# Patient Record
Sex: Male | Born: 1975 | Race: White | Hispanic: No | Marital: Married | Smoking: Never smoker
Health system: Southern US, Community
[De-identification: ages and names within clinical notes are randomized; demographics above are authoritative.]

## PROBLEM LIST (undated history)

## (undated) DIAGNOSIS — K219 Gastro-esophageal reflux disease without esophagitis: Secondary | ICD-10-CM

## (undated) DIAGNOSIS — I10 Essential (primary) hypertension: Secondary | ICD-10-CM

## (undated) HISTORY — PX: WISDOM TOOTH EXTRACTION: SHX21

## (undated) HISTORY — PX: CHOLECYSTECTOMY: SHX55

## (undated) HISTORY — PX: ESOPHAGOGASTRODUODENOSCOPY: SHX1529

---

## 2008-06-10 ENCOUNTER — Inpatient Hospital Stay: Payer: Self-pay | Admitting: Surgery

## 2009-04-20 IMAGING — CT CT STONE STUDY
1 of 2 series · 15 of 32 positions shown, 19 images · non-contrast
Comparison: none

REASON FOR EXAM: epigastric pain radiatign to left flank
COMMENTS:

[Series 2: stone · axial · 0.75mm/px · z∈[-472,-46]mm · 15 of 160 slices shown, 19 images]
[im 12/160  soft-tissue]
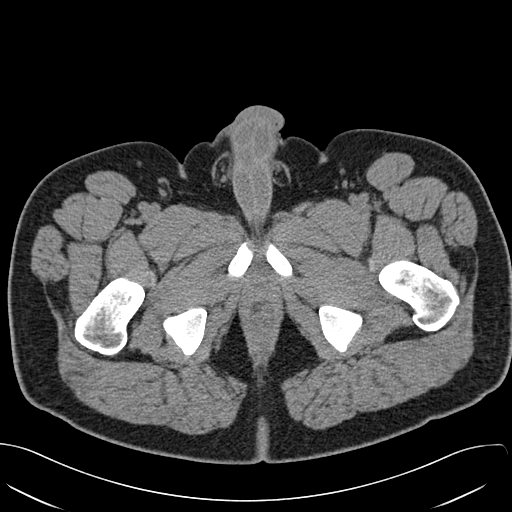
[im 12/160  bone]
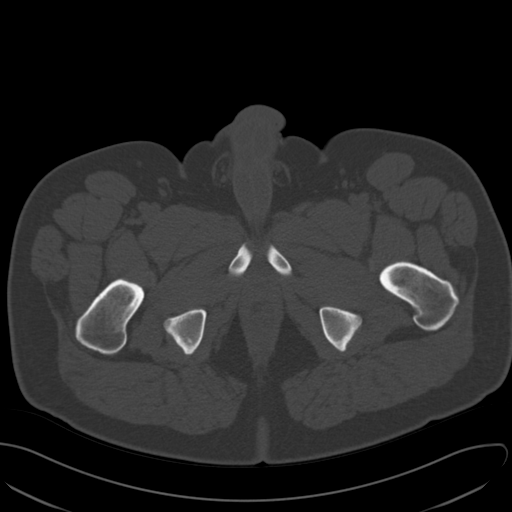
[im 24/160  soft-tissue]
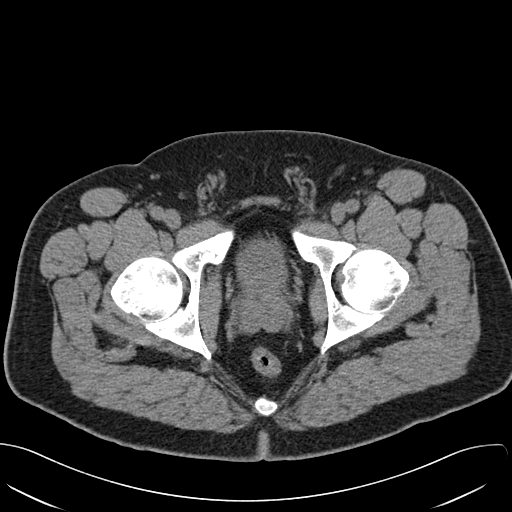
[im 36/160  soft-tissue]
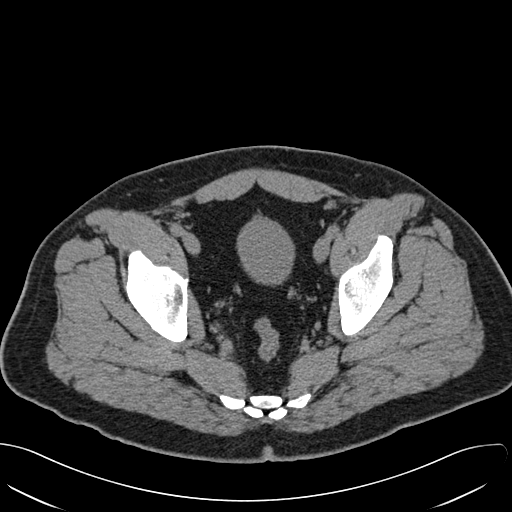
[im 48/160  soft-tissue]
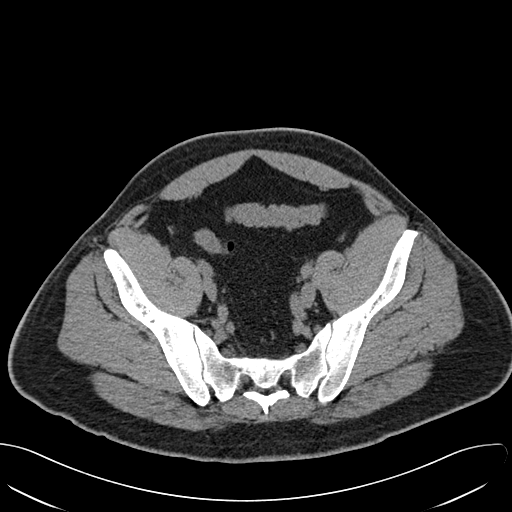
[im 59/160  soft-tissue]
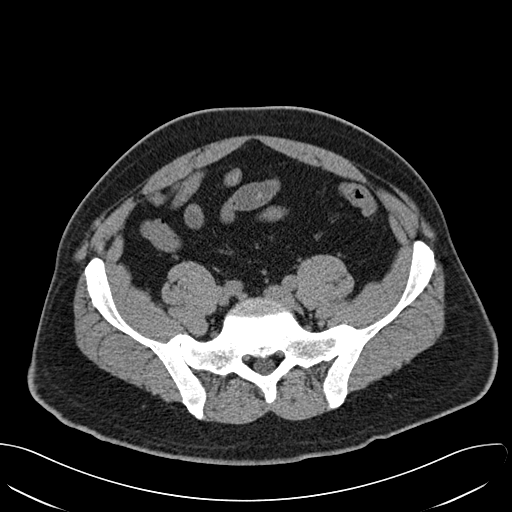
[im 71/160  soft-tissue]
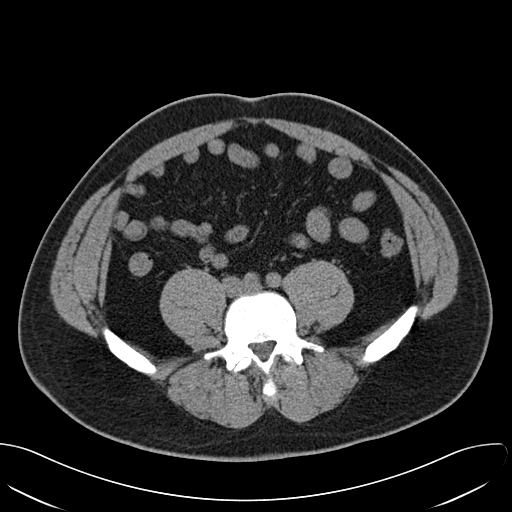
[im 83/160  soft-tissue]
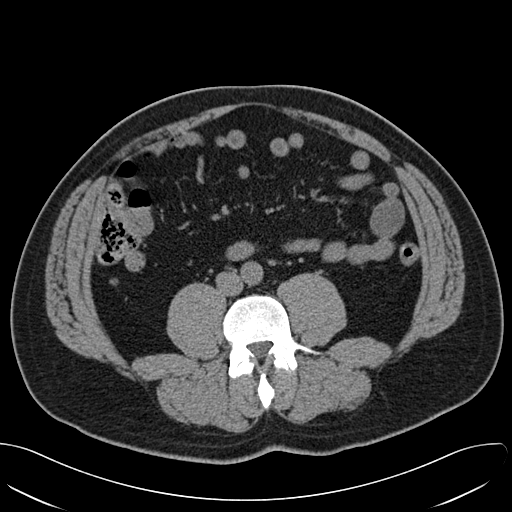
[im 95/160  soft-tissue]
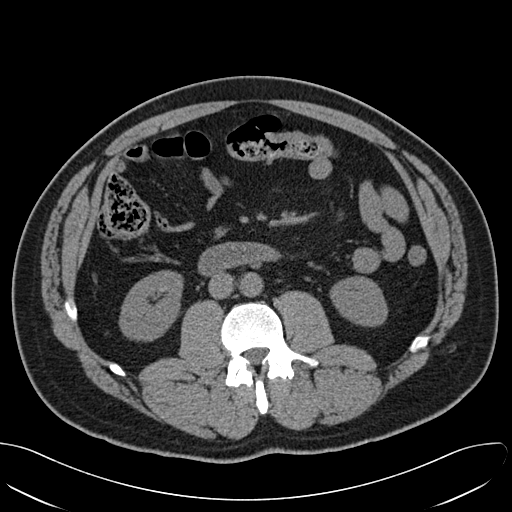
[im 107/160  soft-tissue]
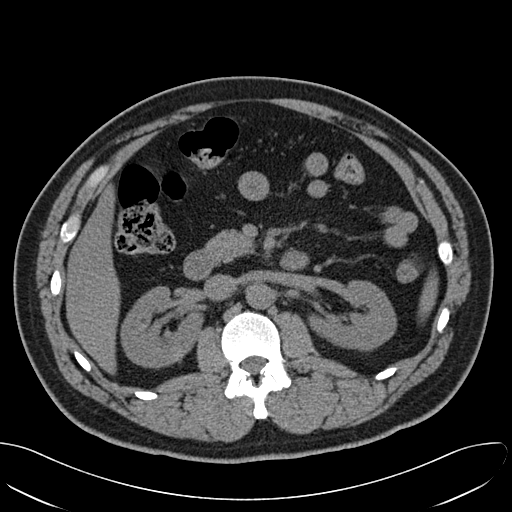
[im 107/160  bone]
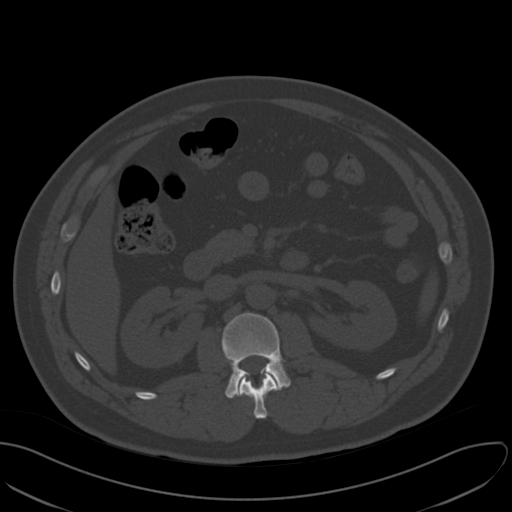
[im 118/160  soft-tissue]
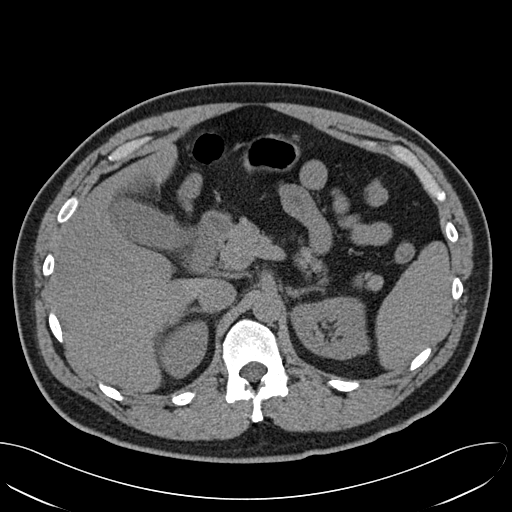
[im 130/160  soft-tissue]
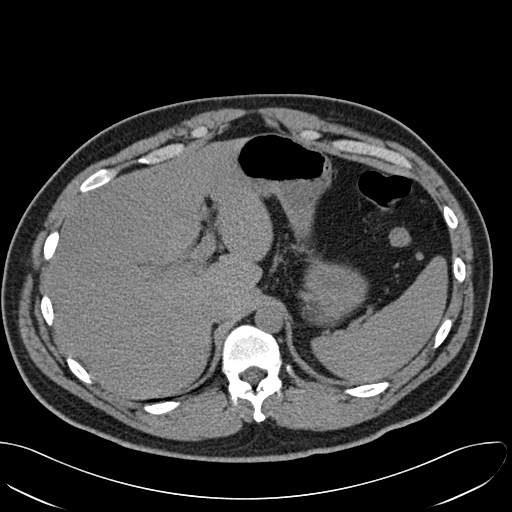
[im 136/160  lung]
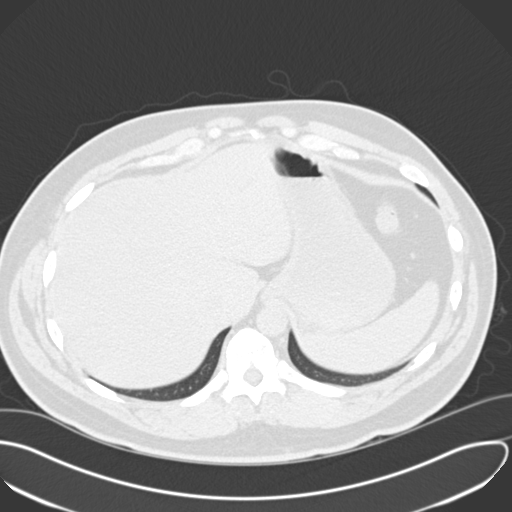
[im 142/160  soft-tissue]
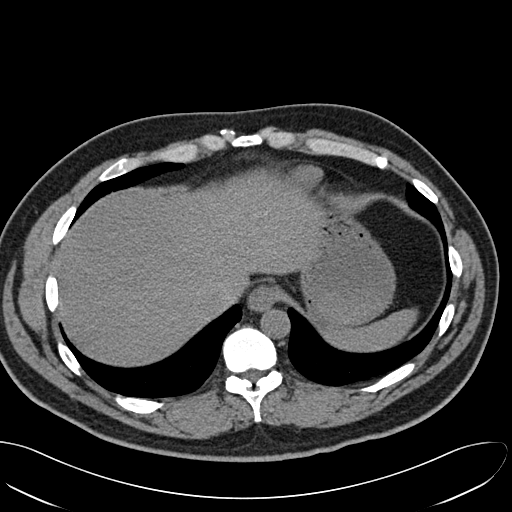
[im 142/160  lung]
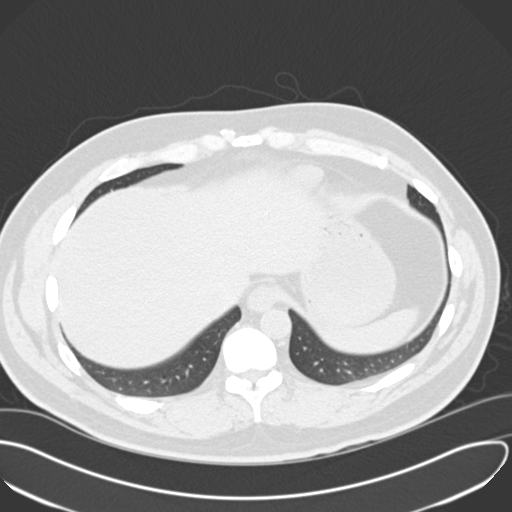
[im 148/160  lung]
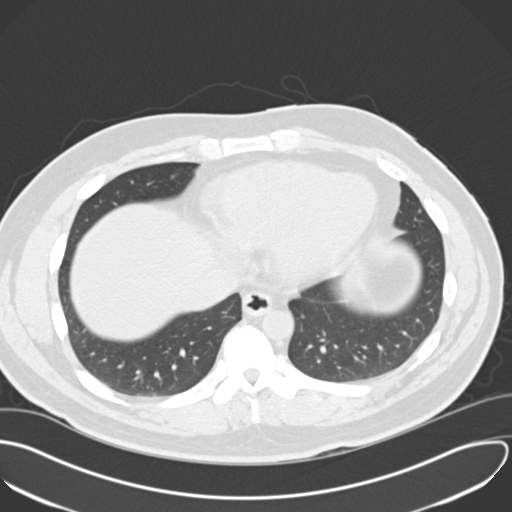
[im 154/160  soft-tissue]
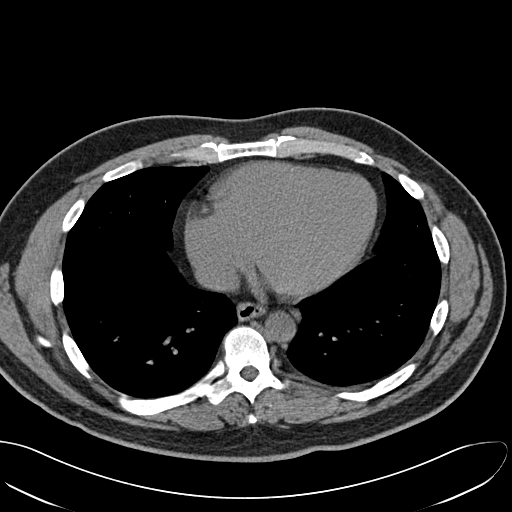
[im 154/160  lung]
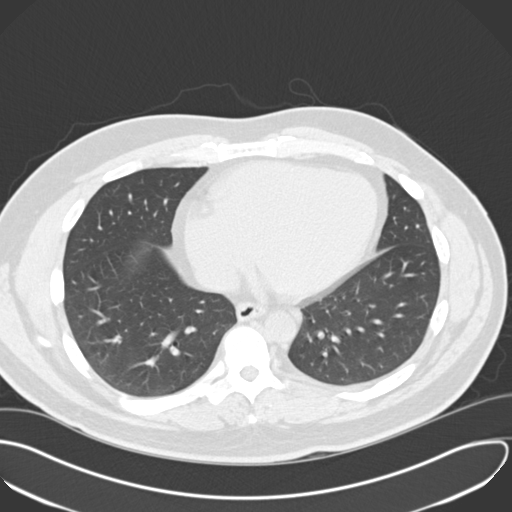

[15 of 32 positions shown; findings below may reference images not displayed]

PROCEDURE:     CT  - CT ABDOMEN /PELVIS WO (STONE)  - June 10, 2008  [DATE]

RESULT:     Helical 3-mm sections were obtained from the lung bases through
the pubic symphysis without the administration of oral or intravenous
contrast.

Evaluation of the lung bases demonstrates no gross abnormalities.

Within the limitations of a noncontrasted CT, the liver, spleen, adrenals
and pancreas are unremarkable. Evaluation of the RIGHT kidney demonstrates a
small area of calcification within the posterior periphery of the cortex.
These are small punctate areas measuring approximately 1-2 mm in diameter.
There is no evidence of hydronephrosis or gross evidence of masses or
further calculi.  There is no CT evidence of bowel obstruction,
diverticulitis, colitis or an abdominal aortic aneurysm or signs reflecting
appendicitis.  The gallbladder is prominent and there appears to be mild
increased density within the surrounding fat. Note, not mentioned above a
very small 5 mm nodular density projects within the periphery of the LEFT
lung base.
IMPRESSION: 1.     Cortical calculi RIGHT kidney.  No further evidence of renal calculus
disease.
2.     Findings possibly representing inflammatory change in the surrounding
pericholecystic fat.  RIGHT upper quadrant ultrasound can be obtained if and
as clinically warranted.
3.     Small 5 mm density within the LEFT lung base.
4.     Dr. Rayshawn of the Emergency Department was informed of these
findings via preliminary fax report on 06/10/08 at [DATE] a.m. CST.

## 2011-01-26 ENCOUNTER — Other Ambulatory Visit (INDEPENDENT_AMBULATORY_CARE_PROVIDER_SITE_OTHER): Payer: Self-pay | Admitting: Internal Medicine

## 2011-01-26 ENCOUNTER — Ambulatory Visit (HOSPITAL_COMMUNITY)
Admission: EM | Admit: 2011-01-26 | Discharge: 2011-01-26 | Disposition: A | Discharge status: Home / Self Care | Payer: BC Managed Care – PPO | Attending: Emergency Medicine | Admitting: Emergency Medicine

## 2011-01-26 DIAGNOSIS — K2210 Ulcer of esophagus without bleeding: Secondary | ICD-10-CM

## 2011-01-26 DIAGNOSIS — K210 Gastro-esophageal reflux disease with esophagitis: Secondary | ICD-10-CM

## 2011-01-26 DIAGNOSIS — T18108A Unspecified foreign body in esophagus causing other injury, initial encounter: Secondary | ICD-10-CM | POA: Insufficient documentation

## 2011-01-26 DIAGNOSIS — K222 Esophageal obstruction: Secondary | ICD-10-CM

## 2011-01-26 DIAGNOSIS — IMO0002 Reserved for concepts with insufficient information to code with codable children: Secondary | ICD-10-CM | POA: Insufficient documentation

## 2011-02-11 NOTE — Op Note (Signed)
**Note Jorge-Identified via Obfuscation** NAME:  [name], [name] NO.:  [id number]  MEDICAL RECORD NO.:  [id number]  LOCATION:  [facility]                          FACILITY:  [facility]  PHYSICIAN:  Lionel December, M.D.    DATE OF BIRTH:  1975-10-17  DATE OF PROCEDURE:  01/26/2011 DATE OF DISCHARGE:                              OPERATIVE REPORT   PROCEDURE:  Esophagoscopy with foreign body removal followed by completion of examination and esophageal dilation.  INDICATION:  [name] is a 35 year old Caucasian male who does not give history of chronic heartburn and dysphagia, has developed difficulty swallowing yesterday while he was eating stick.  This occurred over 24 years ago and he decided to wait.  He has not been able to swallow anything since then.  He is undergoing therapeutic procedure.  Procedure risks were reviewed with the patient and informed consent was obtained.  MEDICATIONS FOR CONSCIOUS SEDATION:  For first part of the procedure was given Cetacaine spray for oropharyngeal topical anesthesia, Demerol 50 mg IV, Versed 10 mg IV and promethazine diluted 12.5 mg IV.  For the second part, he was given MAC in the OR.  FINDINGS:  Procedure was performed in 2 stages.  First stage was performed in endoscopy suite.  The patient was placed in left lateral recumbent position and vital signs and O2 sats were monitored during the procedure and remained stable.  Pentax videoscope was passed through oropharynx without any difficulty into esophagus and no sooner scope was passed into esophagus and he became combative.  Fluid was suctioned out and endoscope was withdrawn.  Procedure was repeated with more sedation. In the distal esophagus, there was a impacted foreign body of meat. There were linear furrows and circumferential rings changes felt to be typical of eosinophilic esophagitis.  A snare was placed around the proximal part of the foreign body and was gently pulled.  During this process, it broke.  A large  chunk of this foreign body was removed.  By this time, he was very restless and combative, therefore, endoscope was withdrawn, and plans were made for him to undergo this procedure with the propofol.  I talked with Mrs. [name] and another consent was obtained.  The patient was taken to the OR and once he was sedated with propofol, Pentax videoscope was passed again.  Rest of the foreign body of meat was located distally.  It was gently pushed down without any resistance. Examination was then completed.  Esophagus.  There were linear furrows and circumferential rings involving the distal esophagus with narrowing and a small ulcer which I felt was due to a heaving and retching.  GE junction was located at 38 and hiatus at 40-cm.  Stomach.  It had some coffee-ground liquid.  Foreign body was also seen. Examination of the stomach was otherwise normal. Fundus and cardia were also seen by retroflexing scope.  Duodenum.  Bulbar and postbulbar mucosa was normal.  Endoscope was pulled back into the stomach.  Balloon dilator was passed through the scope.  Guide was pushed in the gastric lumen.  Balloon dilator was positioned across distal esophagus and slowly insufflator with diameter of 15-mm which were maintained for few minutes and pushed distally.  No disruption was induced.  Since he already had an ulcer, I elected not to stretch it to a larger diameter.  Esophageal biopsy was taken from the body and endoscope was withdrawn.  The patient tolerated the procedure well.  He was taken to PACU in stable condition.  FINAL DIAGNOSIS.  Impacted foreign body at distal esophagus which was partly removed and partly pushed distally.  Esophageal mucosal changes typical of eosinophilic esophagitis with distal narrowing.  Small ulcer at distal esophagus felt to be due to heaving and retching equivalent of Mallory-Weiss tear.  The distal esophagus dilated with a balloon to 15-mm.  Biopsy was  taken from esophageal body.  Small sliding hiatal hernia.  RECOMMENDATIONS:  Antireflux measures.  The patient advised to stay on soft foods for 2 days.  Pantoprazole 40 mg p.o. q.a.m.  I will be contacting the patient with results of biopsy and further recommendations.          ______________________________ Lionel December, M.D.     NR/MEDQ  D:  01/26/2011  T:  01/27/2011  Job:  [id number]  Electronically Signed by Lionel December M.D. on 02/11/2011 09:37:30 PM

## 2016-11-17 ENCOUNTER — Other Ambulatory Visit: Payer: Self-pay | Admitting: Surgery

## 2016-11-17 DIAGNOSIS — R2242 Localized swelling, mass and lump, left lower limb: Secondary | ICD-10-CM

## 2016-11-20 ENCOUNTER — Ambulatory Visit
Admission: RE | Admit: 2016-11-20 | Discharge: 2016-11-20 | Disposition: A | Discharge status: Home / Self Care | Payer: BLUE CROSS/BLUE SHIELD | Source: Ambulatory Visit | Attending: Surgery | Admitting: Surgery

## 2016-11-20 DIAGNOSIS — R2242 Localized swelling, mass and lump, left lower limb: Secondary | ICD-10-CM | POA: Insufficient documentation

## 2016-11-27 ENCOUNTER — Encounter
Admission: RE | Admit: 2016-11-27 | Discharge: 2016-11-27 | Disposition: A | Discharge status: Home / Self Care | Payer: BLUE CROSS/BLUE SHIELD | Source: Ambulatory Visit | Attending: Surgery | Admitting: Surgery

## 2016-11-27 HISTORY — DX: Essential (primary) hypertension: I10

## 2016-11-27 HISTORY — DX: Gastro-esophageal reflux disease without esophagitis: K21.9

## 2016-11-27 NOTE — Patient Instructions (Signed)
  Your procedure is scheduled on: 12-05-16 (Friday) Report to Same Day Surgery 2nd floor medical mall [facility] Entrance-take elevator on left to 2nd floor.  Check in with surgery information desk.) To find out your arrival time please call [phone] between 1PM - 3PM on 12-04-16 (Thursday)  Remember: Instructions that are not followed completely may result in serious medical risk, up to and including death, or upon the discretion of your surgeon and anesthesiologist your surgery may need to be rescheduled.    _x___ 1. Do not eat food or drink liquids after midnight. No gum chewing or hard candies.     __x__ 2. No Alcohol for 24 hours before or after surgery.   __x__3. No Smoking for 24 prior to surgery.   ____  4. Bring all medications with you on the day of surgery if instructed.    __x__ 5. Notify your doctor if there is any change in your medical condition     (cold, fever, infections).     Do not wear jewelry, make-up, hairpins, clips or nail polish.  Do not wear lotions, powders, or perfumes. You may wear deodorant.  Do not shave 48 hours prior to surgery. Men may shave face and neck.  Do not bring valuables to the hospital.    [facility] is not responsible for any belongings or valuables.               Contacts, dentures or bridgework may not be worn into surgery.  Leave your suitcase in the car. After surgery it may be brought to your room.  For patients admitted to the hospital, discharge time is determined by your treatment team.   Patients discharged the day of surgery will not be allowed to drive home.  You will need someone to drive you home and stay with you the night of your procedure.    Please read over the following fact sheets that you were given:     _x___ TAKE THESE MEDICATIONS THE MORNING OF SURGERY WITH A SMALL SIP OF WATER. These include:  1. LISINOPRIL  2.  3.  4.  5.  6.  ____Fleets enema or Magnesium Citrate as directed.   _x___ Use CHG Soap  or sage wipes as directed on instruction sheet   ____ Use inhalers on the day of surgery and bring to hospital day of surgery  ____ Stop Metformin and Janumet 2 days prior to surgery.    ____ Take 1/2 of usual insulin dose the night before surgery and none on the morning surgery.   ____ Follow recommendations from Cardiologist, Pulmonologist or PCP regarding stopping Aspirin, Coumadin, Pllavix ,Eliquis, Effient, or Pradaxa, and Pletal.  X____Stop Anti-inflammatories such as Advil, Aleve, Ibuprofen, Motrin, Naproxen, Naprosyn, Goodies powders or aspirin products NOW- OK to take Tylenol    ____ Stop supplements until after surgery.    ____ Bring C-Pap to the hospital.

## 2016-12-02 ENCOUNTER — Encounter
Admission: RE | Admit: 2016-12-02 | Discharge: 2016-12-02 | Disposition: A | Discharge status: Home / Self Care | Payer: BLUE CROSS/BLUE SHIELD | Source: Ambulatory Visit | Attending: Surgery | Admitting: Surgery

## 2016-12-02 DIAGNOSIS — Z79899 Other long term (current) drug therapy: Secondary | ICD-10-CM | POA: Diagnosis not present

## 2016-12-02 DIAGNOSIS — L989 Disorder of the skin and subcutaneous tissue, unspecified: Secondary | ICD-10-CM | POA: Diagnosis present

## 2016-12-02 DIAGNOSIS — I10 Essential (primary) hypertension: Secondary | ICD-10-CM | POA: Diagnosis not present

## 2016-12-02 DIAGNOSIS — K219 Gastro-esophageal reflux disease without esophagitis: Secondary | ICD-10-CM | POA: Diagnosis not present

## 2016-12-02 DIAGNOSIS — Z881 Allergy status to other antibiotic agents status: Secondary | ICD-10-CM | POA: Diagnosis not present

## 2016-12-02 DIAGNOSIS — Z8249 Family history of ischemic heart disease and other diseases of the circulatory system: Secondary | ICD-10-CM | POA: Diagnosis not present

## 2016-12-02 DIAGNOSIS — Z833 Family history of diabetes mellitus: Secondary | ICD-10-CM | POA: Diagnosis not present

## 2016-12-02 DIAGNOSIS — L720 Epidermal cyst: Secondary | ICD-10-CM | POA: Diagnosis not present

## 2016-12-02 DIAGNOSIS — Z9049 Acquired absence of other specified parts of digestive tract: Secondary | ICD-10-CM | POA: Diagnosis not present

## 2016-12-02 DIAGNOSIS — F17290 Nicotine dependence, other tobacco product, uncomplicated: Secondary | ICD-10-CM | POA: Diagnosis not present

## 2016-12-05 ENCOUNTER — Ambulatory Visit
Admission: RE | Admit: 2016-12-05 | Discharge: 2016-12-05 | Disposition: A | Discharge status: Home / Self Care | Payer: BLUE CROSS/BLUE SHIELD | Source: Ambulatory Visit | Attending: Surgery | Admitting: Surgery

## 2016-12-05 ENCOUNTER — Encounter: Payer: Self-pay | Admitting: *Deleted

## 2016-12-05 ENCOUNTER — Ambulatory Visit: Payer: BLUE CROSS/BLUE SHIELD | Admitting: Certified Registered"

## 2016-12-05 ENCOUNTER — Encounter
Admission: RE | Disposition: A | Discharge status: Home / Self Care | Payer: Self-pay | Source: Ambulatory Visit | Attending: Surgery

## 2016-12-05 DIAGNOSIS — K219 Gastro-esophageal reflux disease without esophagitis: Secondary | ICD-10-CM | POA: Insufficient documentation

## 2016-12-05 DIAGNOSIS — I10 Essential (primary) hypertension: Secondary | ICD-10-CM | POA: Insufficient documentation

## 2016-12-05 DIAGNOSIS — L720 Epidermal cyst: Secondary | ICD-10-CM | POA: Diagnosis not present

## 2016-12-05 DIAGNOSIS — Z8249 Family history of ischemic heart disease and other diseases of the circulatory system: Secondary | ICD-10-CM | POA: Insufficient documentation

## 2016-12-05 DIAGNOSIS — Z881 Allergy status to other antibiotic agents status: Secondary | ICD-10-CM | POA: Insufficient documentation

## 2016-12-05 DIAGNOSIS — Z79899 Other long term (current) drug therapy: Secondary | ICD-10-CM | POA: Insufficient documentation

## 2016-12-05 DIAGNOSIS — F17290 Nicotine dependence, other tobacco product, uncomplicated: Secondary | ICD-10-CM | POA: Insufficient documentation

## 2016-12-05 DIAGNOSIS — Z833 Family history of diabetes mellitus: Secondary | ICD-10-CM | POA: Insufficient documentation

## 2016-12-05 DIAGNOSIS — Z9049 Acquired absence of other specified parts of digestive tract: Secondary | ICD-10-CM | POA: Insufficient documentation

## 2016-12-05 HISTORY — PX: EXCISION MASS LOWER EXTREMETIES: SHX6705

## 2016-12-05 SURGERY — EXCISION MASS LOWER EXTREMITIES
Anesthesia: Choice | Laterality: Left | Wound class: Clean

## 2016-12-05 MED ORDER — ONDANSETRON HCL 4 MG/2ML IJ SOLN
INTRAMUSCULAR | Status: AC
  Filled 2016-12-05: qty 2

## 2016-12-05 MED ORDER — PHENYLEPHRINE HCL 10 MG/ML IJ SOLN
INTRAMUSCULAR | Status: AC
  Filled 2016-12-05: qty 1

## 2016-12-05 MED ORDER — HYDROCODONE-ACETAMINOPHEN 5-325 MG PO TABS: 1.0000 | ORAL_TABLET | ORAL | 0 refills | Status: AC | PRN

## 2016-12-05 MED ORDER — LIDOCAINE HCL (PF) 2 % IJ SOLN
INTRAMUSCULAR | Status: AC
  Filled 2016-12-05: qty 2

## 2016-12-05 MED ORDER — PHENYLEPHRINE HCL 10 MG/ML IJ SOLN
INTRAMUSCULAR | Status: DC | PRN
  Administered 2016-12-05 (×3): 100 ug via INTRAVENOUS

## 2016-12-05 MED ORDER — BUPIVACAINE HCL (PF) 0.5 % IJ SOLN
INTRAMUSCULAR | Status: AC
  Filled 2016-12-05: qty 30

## 2016-12-05 MED ORDER — DEXAMETHASONE SODIUM PHOSPHATE 10 MG/ML IJ SOLN
INTRAMUSCULAR | Status: AC
  Filled 2016-12-05: qty 1

## 2016-12-05 MED ORDER — SEVOFLURANE IN SOLN
RESPIRATORY_TRACT | Status: AC
  Filled 2016-12-05: qty 250

## 2016-12-05 MED ORDER — LIDOCAINE HCL (PF) 1 % IJ SOLN
INTRAMUSCULAR | Status: AC
  Filled 2016-12-05: qty 30

## 2016-12-05 MED ORDER — FENTANYL CITRATE (PF) 100 MCG/2ML IJ SOLN
INTRAMUSCULAR | Status: DC | PRN
  Administered 2016-12-05 (×2): 50 ug via INTRAVENOUS

## 2016-12-05 MED ORDER — DEXAMETHASONE SODIUM PHOSPHATE 10 MG/ML IJ SOLN
INTRAMUSCULAR | Status: DC | PRN
  Administered 2016-12-05: 10 mg via INTRAVENOUS

## 2016-12-05 MED ORDER — PROPOFOL 10 MG/ML IV BOLUS
INTRAVENOUS | Status: DC | PRN
  Administered 2016-12-05: 150 mg via INTRAVENOUS

## 2016-12-05 MED ORDER — FENTANYL CITRATE (PF) 100 MCG/2ML IJ SOLN
INTRAMUSCULAR | Status: AC
  Filled 2016-12-05: qty 2

## 2016-12-05 MED ORDER — MIDAZOLAM HCL 2 MG/2ML IJ SOLN
INTRAMUSCULAR | Status: DC | PRN
  Administered 2016-12-05: 2 mg via INTRAVENOUS

## 2016-12-05 MED ORDER — FENTANYL CITRATE (PF) 100 MCG/2ML IJ SOLN: 25.0000 ug | INTRAMUSCULAR | Status: DC | PRN

## 2016-12-05 MED ORDER — GLYCOPYRROLATE 0.2 MG/ML IJ SOLN
INTRAMUSCULAR | Status: DC | PRN
  Administered 2016-12-05: 0.2 mg via INTRAVENOUS

## 2016-12-05 MED ORDER — ONDANSETRON HCL 4 MG/2ML IJ SOLN
INTRAMUSCULAR | Status: DC | PRN
  Administered 2016-12-05: 4 mg via INTRAVENOUS

## 2016-12-05 MED ORDER — LIDOCAINE HCL (CARDIAC) 20 MG/ML IV SOLN
INTRAVENOUS | Status: DC | PRN
  Administered 2016-12-05: 100 mg via INTRAVENOUS

## 2016-12-05 MED ORDER — FAMOTIDINE 20 MG PO TABS
20.0000 mg | ORAL_TABLET | Freq: Once | ORAL | Status: AC
  Administered 2016-12-05: 20 mg via ORAL

## 2016-12-05 MED ORDER — PROPOFOL 10 MG/ML IV BOLUS
INTRAVENOUS | Status: AC
  Filled 2016-12-05: qty 20

## 2016-12-05 MED ORDER — LACTATED RINGERS IV SOLN
INTRAVENOUS | Status: DC
  Administered 2016-12-05: 50 mL/h via INTRAVENOUS
  Administered 2016-12-05: 08:00:00 via INTRAVENOUS

## 2016-12-05 MED ORDER — BUPIVACAINE-EPINEPHRINE (PF) 0.5% -1:200000 IJ SOLN
INTRAMUSCULAR | Status: DC | PRN
  Administered 2016-12-05: 4 mL via PERINEURAL

## 2016-12-05 MED ORDER — GLYCOPYRROLATE 0.2 MG/ML IJ SOLN
INTRAMUSCULAR | Status: AC
  Filled 2016-12-05: qty 1

## 2016-12-05 MED ORDER — HYDROCODONE-ACETAMINOPHEN 5-325 MG PO TABS: 1.0000 | ORAL_TABLET | ORAL | Status: DC | PRN

## 2016-12-05 MED ORDER — FAMOTIDINE 20 MG PO TABS
ORAL_TABLET | ORAL | Status: AC
  Filled 2016-12-05: qty 1

## 2016-12-05 MED ORDER — MIDAZOLAM HCL 2 MG/2ML IJ SOLN
INTRAMUSCULAR | Status: AC
  Filled 2016-12-05: qty 2

## 2016-12-05 MED ORDER — BUPIVACAINE-EPINEPHRINE (PF) 0.5% -1:200000 IJ SOLN
INTRAMUSCULAR | Status: AC
  Filled 2016-12-05: qty 30

## 2016-12-05 MED ORDER — ONDANSETRON HCL 4 MG/2ML IJ SOLN: 4.0000 mg | Freq: Once | INTRAMUSCULAR | Status: DC | PRN

## 2016-12-05 SURGICAL SUPPLY — 33 items
BLADE SURG 15 STRL LF DISP TIS (BLADE) ×1 IMPLANT
BLADE SURG 15 STRL SS (BLADE) ×2
CHLORAPREP W/TINT 26ML (MISCELLANEOUS) ×3 IMPLANT
DECANTER SPIKE VIAL GLASS SM (MISCELLANEOUS) ×3 IMPLANT
DERMABOND ADVANCED (GAUZE/BANDAGES/DRESSINGS)
DERMABOND ADVANCED .7 DNX12 (GAUZE/BANDAGES/DRESSINGS) IMPLANT
DRAPE LAPAROTOMY 100X77 ABD (DRAPES) ×3 IMPLANT
ELECT REM PT RETURN 9FT ADLT (ELECTROSURGICAL) ×3
ELECTRODE REM PT RTRN 9FT ADLT (ELECTROSURGICAL) ×1 IMPLANT
GAUZE SPONGE 4X4 12PLY STRL (GAUZE/BANDAGES/DRESSINGS) ×3 IMPLANT
GLOVE BIO SURGEON STRL SZ7.5 (GLOVE) ×3 IMPLANT
GLOVE BIOGEL M 6.5 STRL (GLOVE) ×6 IMPLANT
GLOVE BIOGEL PI IND STRL 7.0 (GLOVE) ×3 IMPLANT
GLOVE BIOGEL PI INDICATOR 7.0 (GLOVE) ×6
GOWN STRL REUS W/ TWL LRG LVL3 (GOWN DISPOSABLE) ×3 IMPLANT
GOWN STRL REUS W/TWL LRG LVL3 (GOWN DISPOSABLE) ×6
KIT RM TURNOVER STRD PROC AR (KITS) ×3 IMPLANT
LABEL OR SOLS (LABEL) ×3 IMPLANT
NEEDLE HYPO 25X1 1.5 SAFETY (NEEDLE) ×3 IMPLANT
NS IRRIG 500ML POUR BTL (IV SOLUTION) ×3 IMPLANT
PACK BASIN MINOR ARMC (MISCELLANEOUS) ×3 IMPLANT
SUT ETHILON 3-0 FS-10 30 BLK (SUTURE) ×3
SUT ETHILON 4-0 (SUTURE) ×4
SUT ETHILON 4-0 FS2 18XMFL BLK (SUTURE) ×2
SUT MNCRL 3-0 UNDYED SH (SUTURE) IMPLANT
SUT MONOCRYL 3-0 UNDYED (SUTURE)
SUT VIC AB 4-0 SH 27 (SUTURE) ×2
SUT VIC AB 4-0 SH 27XANBCTRL (SUTURE) ×1 IMPLANT
SUTURE EHLN 3-0 FS-10 30 BLK (SUTURE) ×1 IMPLANT
SUTURE ETHLN 4-0 FS2 18XMF BLK (SUTURE) ×2 IMPLANT
SWABSTK COMLB BENZOIN TINCTURE (MISCELLANEOUS) ×3 IMPLANT
SYR BULB EAR ULCER 3OZ GRN STR (SYRINGE) ×3 IMPLANT
SYRINGE 10CC LL (SYRINGE) ×3 IMPLANT

## 2016-12-05 NOTE — Discharge Instructions (Addendum)
Take Tylenol or Norco if needed for pain.  Should not drive or do anything dangerous when taking Norco.  Remove dressing on Sunday. May then shower and blot dry.  Gradually resume usual activities as tolerated.  AMBULATORY SURGERY  DISCHARGE INSTRUCTIONS   1) The drugs that you were given will stay in your system until tomorrow so for the next 24 hours you should not:  A) Drive an automobile B) Make any legal decisions C) Drink any alcoholic beverage   2) You may resume regular meals tomorrow.  Today it is better to start with liquids and gradually work up to solid foods.  You may eat anything you prefer, but it is better to start with liquids, then soup and crackers, and gradually work up to solid foods.   3) Please notify your doctor immediately if you have any unusual bleeding, trouble breathing, redness and pain at the surgery site, drainage, fever, or pain not relieved by medication.    4) Additional Instructions:        Please contact your physician with any problems or Same Day Surgery at [phone], Monday through Friday 6 am to 4 pm, or [facility] at [facility] Main number at [phone].

## 2016-12-05 NOTE — Anesthesia Preprocedure Evaluation (Signed)
Anesthesia Evaluation  Patient identified by MRN, date of birth, ID band Patient awake    Reviewed: Allergy & Precautions, NPO status , Patient's Chart, lab work & pertinent test results  Airway Mallampati: II       Dental   Pulmonary neg pulmonary ROS,    Pulmonary exam normal        Cardiovascular hypertension, Pt. on medications Normal cardiovascular exam     Neuro/Psych negative neurological ROS  negative psych ROS   GI/Hepatic Neg liver ROS, GERD  Controlled,Rare reflux   Endo/Other  negative endocrine ROS  Renal/GU negative Renal ROS  negative genitourinary   Musculoskeletal negative musculoskeletal ROS (+)   Abdominal Normal abdominal exam  (+)   Peds negative pediatric ROS (+)  Hematology negative hematology ROS (+)   Anesthesia Other Findings   Reproductive/Obstetrics                             Anesthesia Physical Anesthesia Plan  ASA: II  Anesthesia Plan:    Post-op Pain Management:    Induction: Intravenous  Airway Management Planned: LMA  Additional Equipment:   Intra-op Plan:   Post-operative Plan: Extubation in OR  Informed Consent: I have reviewed the patients History and Physical, chart, labs and discussed the procedure including the risks, benefits and alternatives for the proposed anesthesia with the patient or authorized representative who has indicated his/her understanding and acceptance.     Plan Discussed with: CRNA and Surgeon  Anesthesia Plan Comments:         Anesthesia Quick Evaluation

## 2016-12-05 NOTE — Anesthesia Procedure Notes (Signed)
Procedure Name: LMA Insertion Date/Time: 12/05/2016 7:44 AM Performed by: Michaele OfferSAVAGE, Kerilyn Cortner Pre-anesthesia Checklist: Patient identified, Emergency Drugs available, Suction available, Patient being monitored and Timeout performed Patient Re-evaluated:Patient Re-evaluated prior to inductionOxygen Delivery Method: Circle system utilized Preoxygenation: Pre-oxygenation with 100% oxygen Intubation Type: IV induction Ventilation: Mask ventilation without difficulty LMA: LMA inserted LMA Size: 4.0 Number of attempts: 1 Placement Confirmation: positive ETCO2 and breath sounds checked- equal and bilateral Tube secured with: Tape Dental Injury: Teeth and Oropharynx as per pre-operative assessment  Comments: Patient in pre-op had multiple chipped teeth. No damage to teeth during placement of LMA

## 2016-12-05 NOTE — Anesthesia Post-op Follow-up Note (Cosign Needed)
Anesthesia QCDR form completed.        

## 2016-12-05 NOTE — H&P (Signed)
  He reports no change in condition since office exam.  Left side marked YES.  Discussed plan for excision mass of left leg.

## 2016-12-05 NOTE — Transfer of Care (Signed)
Immediate Anesthesia Transfer of Care Note  Patient: Earmon PhoenixWilliam A Middlesworth  Procedure(s) Performed: Procedure(s): EXCISION MASS LOWER EXTREMETIES (Left)  Patient Location: PACU  Anesthesia Type:General  Level of Consciousness: awake, drowsy and patient cooperative  Airway & Oxygen Therapy: Patient Spontanous Breathing and Patient connected to face mask oxygen  Post-op Assessment: Report given to RN, Post -op Vital signs reviewed and stable and Patient moving all extremities X 4  Post vital signs: Reviewed and stable  Last Vitals:  Vitals:   12/05/16 0940 12/05/16 1001  BP: 118/65 124/74  Pulse: 60 (!) 53  Resp: 16 16  Temp: 36.1 C     Last Pain:  Vitals:   12/05/16 1001  TempSrc:   PainSc: 0-No pain      Patients Stated Pain Goal: 0 (12/05/16 0612)  Complications: No apparent anesthesia complications

## 2016-12-05 NOTE — Anesthesia Postprocedure Evaluation (Signed)
Anesthesia Post Note  Patient: [name]  Procedure(s) Performed: Procedure(s) (LRB): EXCISION MASS LOWER EXTREMETIES (Left)  Patient location during evaluation: PACU Anesthesia Type: General Level of consciousness: awake and alert and oriented Pain management: pain level controlled Vital Signs Assessment: post-procedure vital signs reviewed and stable Respiratory status: spontaneous breathing Cardiovascular status: blood pressure returned to baseline Anesthetic complications: no     Last Vitals:  Vitals:   12/05/16 0940 12/05/16 1001  BP: 118/65 124/74  Pulse: 60 (!) 53  Resp: 16 16  Temp: 36.1 C     Last Pain:  Vitals:   12/05/16 1001  TempSrc:   PainSc: 0-No pain                 Tally Mattox

## 2016-12-05 NOTE — Op Note (Signed)
OPERATIVE REPORT  PREOPERATIVE  DIAGNOSIS: . Subcutaneous mass of left thigh  POSTOPERATIVE DIAGNOSIS: . Subcutaneous mass of left thigh  PROCEDURE: . Excision subcutaneous mass of left thigh  ANESTHESIA:  General  SURGEON: Renda RollsWilton Jerell Demery  MD   INDICATIONS: . He reports a many year history of a gradually enlarging mass of the left thigh with moderate discomfort. Excision was recommended for definitive treatment.  With the patient on the operating table in the supine position the left medial thigh was prepared with ChloraPrep and draped in a sterile manner. A longitudinally oriented elliptical excision was carried out removing an ellipse of skin directly overlying the mass. Dissection was carried down to encounter a cystic-appearing mass which was dissected free from surrounding structures finding a smooth plane of dissection and minimal lobulation. The mass measured 6 cm in dimension. The proximal end of the skin ellipse was tagged with a stitch. The mass was submitted for routine pathology. Several small bleeding points were cauterized. Subcuticular tissues were infiltrated with half percent Sensorcaine with epinephrine. The  edges were minimally undermined in the subcutaneous tissues to allow advancement of the skin. Location of the saphenous vein was noted. The wound was closed with interrupted 4-0 nylon vertical mattress sutures. The wound was dressed with folded cotton gauze benzoin and 2 inch paper tape.  The patient tolerated surgery satisfactorily and was then prepared for transfer to the recovery room  [facility] ModestoWilton Laster Appling M.D.

## 2016-12-08 LAB — SURGICAL PATHOLOGY

## 2018-08-03 IMAGING — US US EXTREM LOW*L* LIMITED
1 series · 14 of 19 positions shown · non-contrast
Comparison: None.

CLINICAL DATA: Chronic left thigh mass x 20 years, increased x1
year, overlying bruising

EXAM:
ULTRASOUND LEFT LOWER EXTREMITY LIMITED
TECHNIQUE: Ultrasound examination of the lower extremity soft tissues was
performed in the area of clinical concern.

[Series 1: us extrem low*left* limited · 0.08mm/px · 19 acquisitions, 14 frames shown]
[im 1/19]
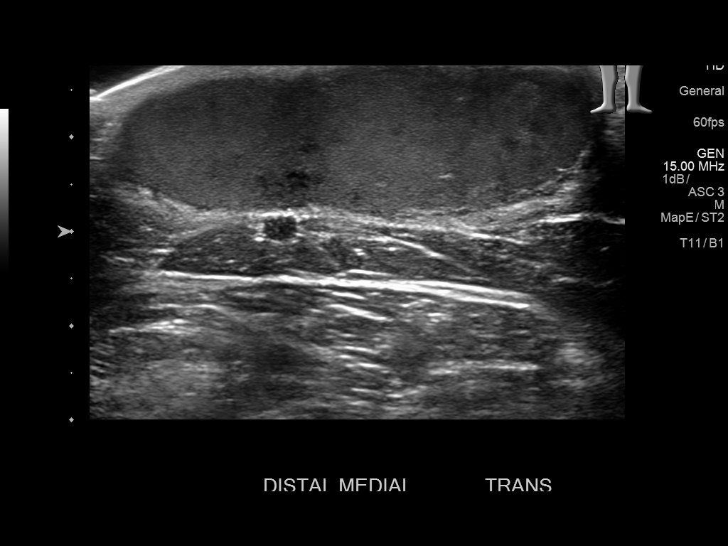
[im 3/19]
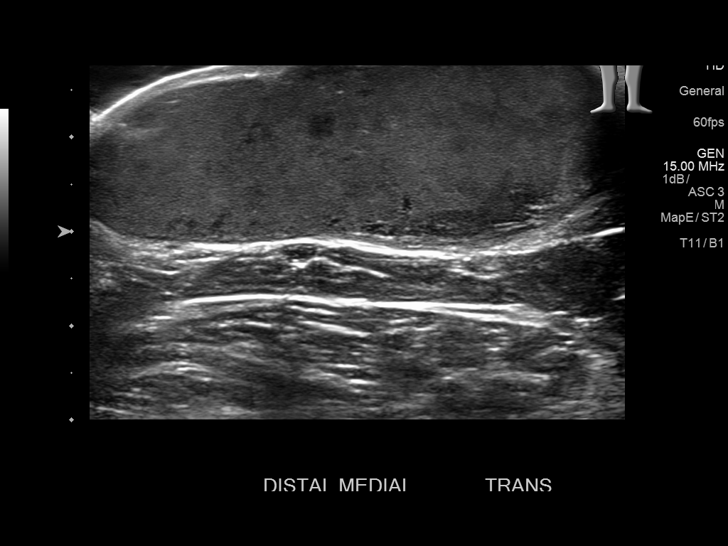
[im 4/19]
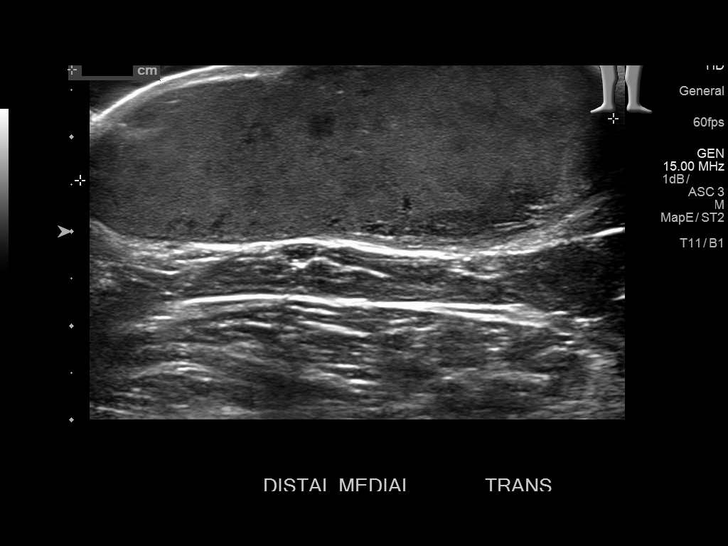
[im 5/19]
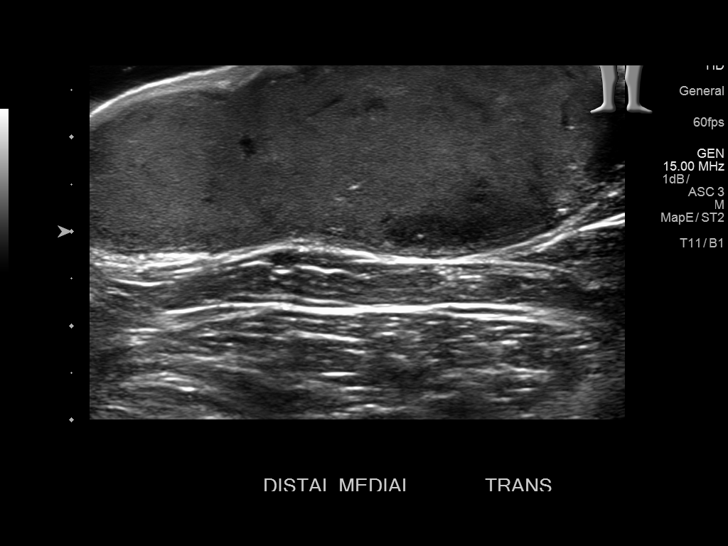
[im 7/19]
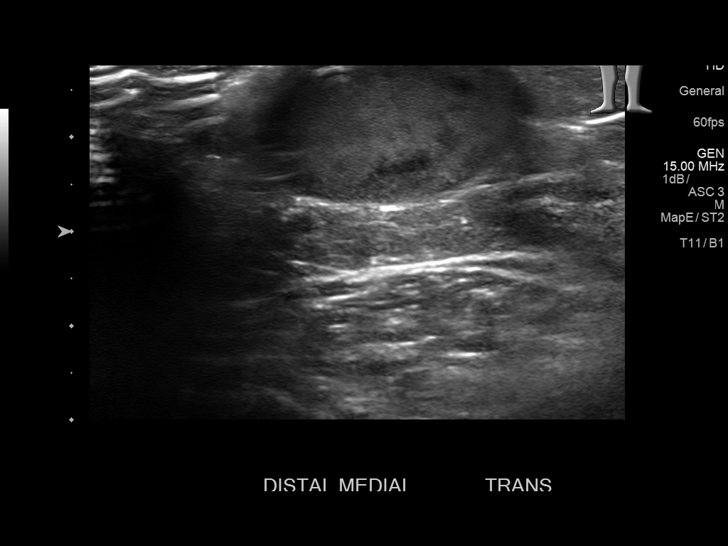
[im 8/19]
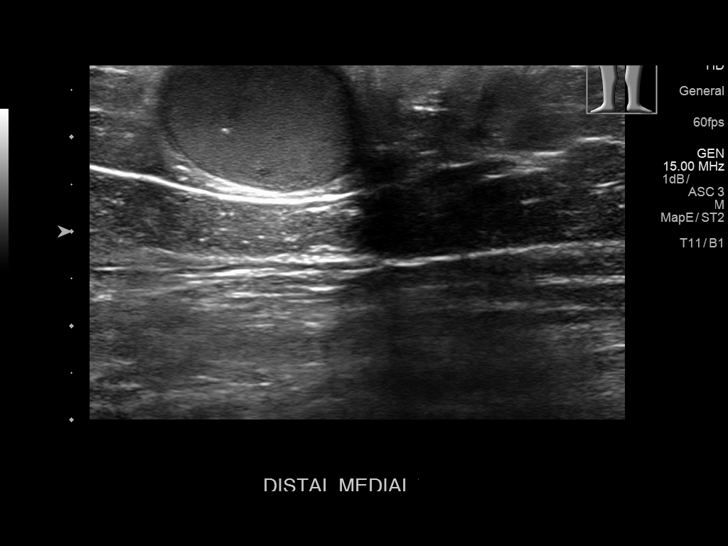
[im 9/19]
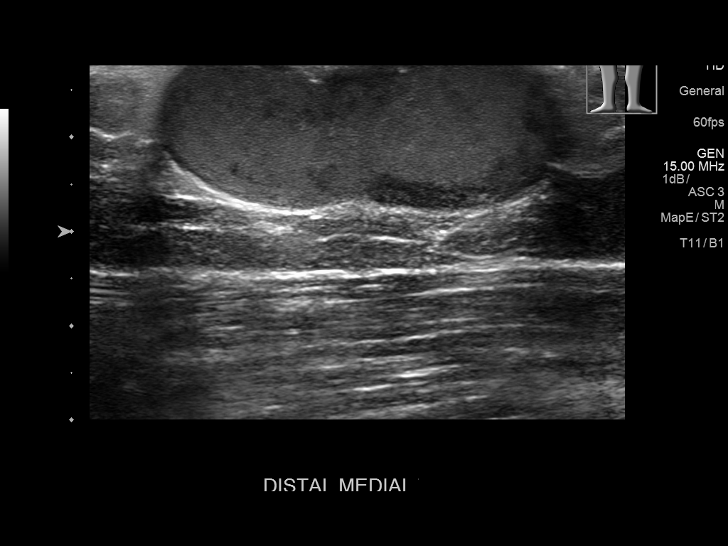
[im 11/19]
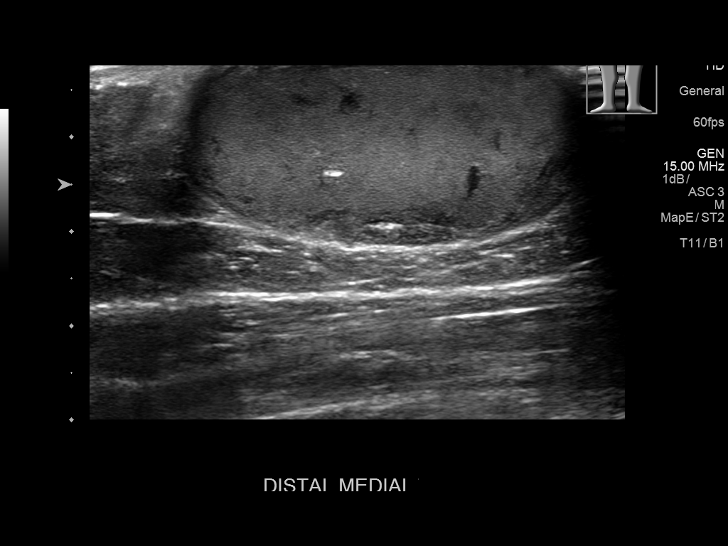
[im 12/19]
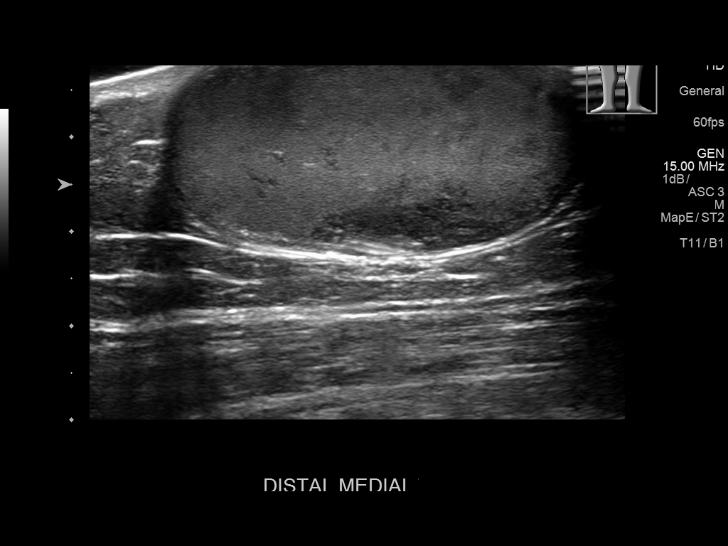
[im 13/19]
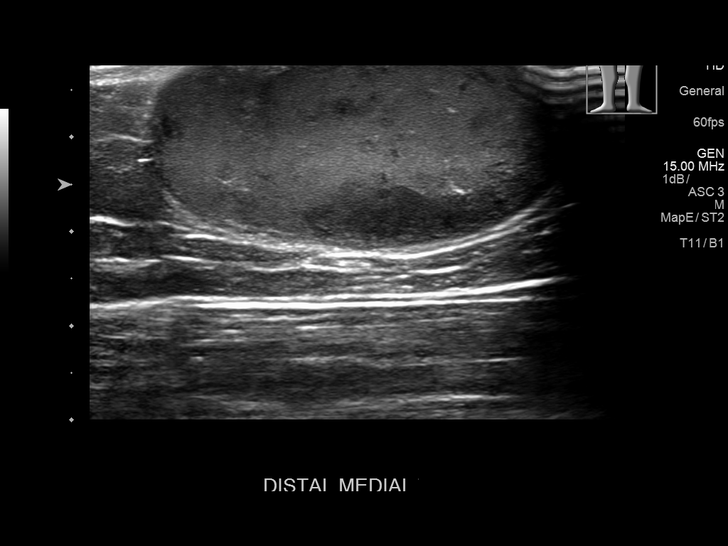
[im 15/19]
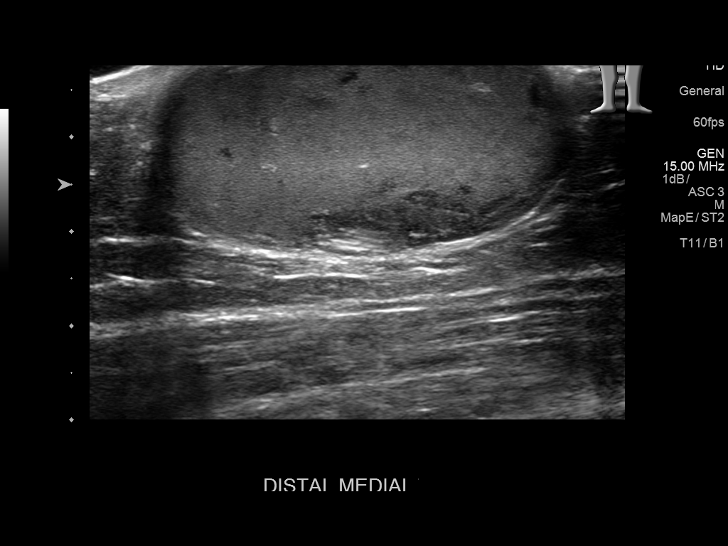
[im 16/19]
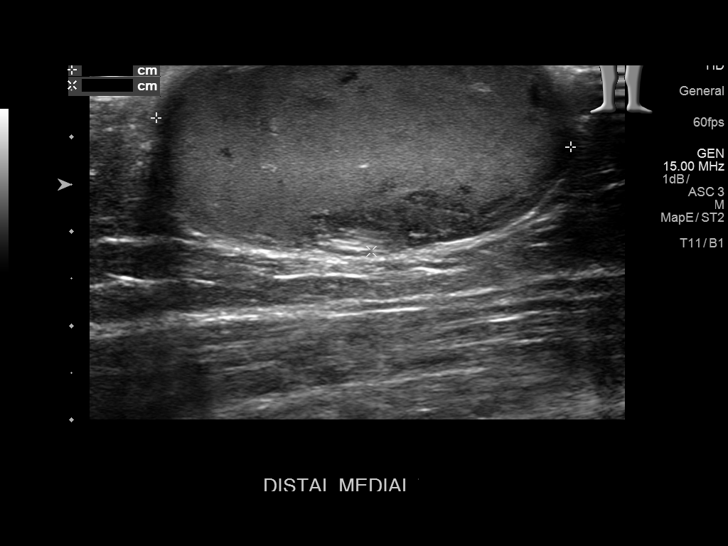
[im 17/19]
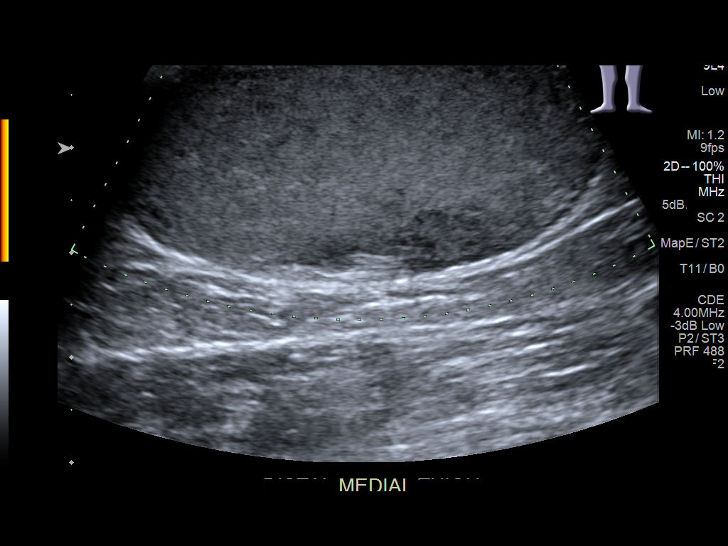
[im 19/19]
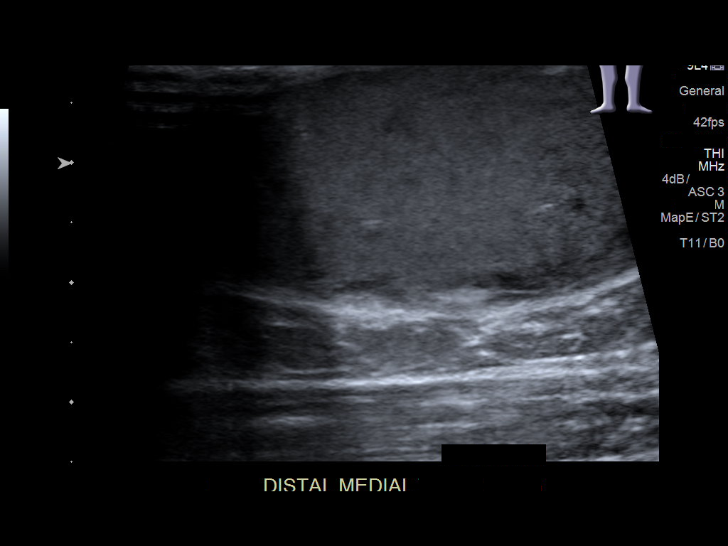

[14 of 19 positions shown; findings below may reference images not displayed]

FINDINGS: 4.4 x 2.1 x 5.7 cm hypoechoic mass in the subcutaneous tissues,
superficial to the muscle, with increased through transmission and
peripheral rim calcifications. No color Doppler flow.

Appearance favors a subcutaneous hematoma. However, the clinical
history is not compatible with this imaging diagnosis.
IMPRESSION: 5.7 cm hypoechoic subcutaneous mass, without color Doppler flow,
favoring a subcutaneous hematoma on ultrasound. However, this is
inconsistent with the clinical history.

Consider percutaneous sampling or MR with/without contrast for
further imaging characterization, as clinically warranted.
